# Patient Record
Sex: Female | Born: 1995 | Race: White | Hispanic: No | Marital: Single | Smoking: Current every day smoker
Health system: Southern US, Community
[De-identification: ages and names within clinical notes are randomized; demographics above are authoritative.]

## PROBLEM LIST (undated history)

## (undated) DIAGNOSIS — F410 Panic disorder [episodic paroxysmal anxiety] without agoraphobia: Secondary | ICD-10-CM

## (undated) DIAGNOSIS — R51 Headache: Secondary | ICD-10-CM

## (undated) DIAGNOSIS — F419 Anxiety disorder, unspecified: Secondary | ICD-10-CM

## (undated) HISTORY — PX: WISDOM TOOTH EXTRACTION: SHX21

## (undated) HISTORY — PX: TEAR DUCT PROBING: SHX793

## (undated) HISTORY — DX: Headache: R51

---
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
## 2013-01-27 ENCOUNTER — Ambulatory Visit: Payer: 59 | Admitting: Nurse Practitioner

## 2013-08-15 ENCOUNTER — Emergency Department (HOSPITAL_COMMUNITY)
Admission: EM | Admit: 2013-08-15 | Discharge: 2013-08-15 | Disposition: A | Discharge status: Home / Self Care | Payer: Worker's Compensation | Attending: Emergency Medicine | Admitting: Emergency Medicine

## 2013-08-15 ENCOUNTER — Emergency Department (HOSPITAL_COMMUNITY): Payer: Worker's Compensation

## 2013-08-15 ENCOUNTER — Encounter (HOSPITAL_COMMUNITY): Payer: Self-pay | Admitting: Emergency Medicine

## 2013-08-15 DIAGNOSIS — F17200 Nicotine dependence, unspecified, uncomplicated: Secondary | ICD-10-CM | POA: Insufficient documentation

## 2013-08-15 DIAGNOSIS — Z79899 Other long term (current) drug therapy: Secondary | ICD-10-CM | POA: Insufficient documentation

## 2013-08-15 DIAGNOSIS — Z880 Allergy status to penicillin: Secondary | ICD-10-CM | POA: Insufficient documentation

## 2013-08-15 DIAGNOSIS — Y939 Activity, unspecified: Secondary | ICD-10-CM | POA: Insufficient documentation

## 2013-08-15 DIAGNOSIS — M25522 Pain in left elbow: Secondary | ICD-10-CM

## 2013-08-15 DIAGNOSIS — S6990XA Unspecified injury of unspecified wrist, hand and finger(s), initial encounter: Principal | ICD-10-CM

## 2013-08-15 DIAGNOSIS — F411 Generalized anxiety disorder: Secondary | ICD-10-CM | POA: Insufficient documentation

## 2013-08-15 DIAGNOSIS — Y990 Civilian activity done for income or pay: Secondary | ICD-10-CM | POA: Insufficient documentation

## 2013-08-15 DIAGNOSIS — S59909A Unspecified injury of unspecified elbow, initial encounter: Secondary | ICD-10-CM | POA: Insufficient documentation

## 2013-08-15 DIAGNOSIS — W010XXA Fall on same level from slipping, tripping and stumbling without subsequent striking against object, initial encounter: Secondary | ICD-10-CM | POA: Insufficient documentation

## 2013-08-15 DIAGNOSIS — S59919A Unspecified injury of unspecified forearm, initial encounter: Principal | ICD-10-CM

## 2013-08-15 DIAGNOSIS — Y9289 Other specified places as the place of occurrence of the external cause: Secondary | ICD-10-CM | POA: Insufficient documentation

## 2013-08-15 MED ORDER — IBUPROFEN 800 MG PO TABS
800.0000 mg | ORAL_TABLET | Freq: Once | ORAL | Status: AC
  Administered 2013-08-15: 800 mg via ORAL
  Filled 2013-08-15: qty 1

## 2013-08-15 MED ORDER — NAPROXEN 500 MG PO TABS: 500.0000 mg | ORAL_TABLET | Freq: Two times a day (BID) | ORAL | Status: AC

## 2013-08-15 MED ORDER — OXYCODONE-ACETAMINOPHEN 5-325 MG PO TABS: 2.0000 | ORAL_TABLET | Freq: Once | ORAL | Status: DC

## 2013-08-15 NOTE — Discharge Instructions (Signed)
°Arthralgia °Your caregiver has diagnosed you as suffering from an arthralgia. Arthralgia means there is pain in a joint. This can come from many reasons including: °· Bruising the joint which causes soreness (inflammation) in the joint. °· Wear and tear on the joints which occur as we grow older (osteoarthritis). °· Overusing the joint. °· Various forms of arthritis. °· Infections of the joint. °Regardless of the cause of pain in your joint, most of these different pains respond to anti-inflammatory drugs and rest. The exception to this is when a joint is infected, and these cases are treated with antibiotics, if it is a bacterial infection. °HOME CARE INSTRUCTIONS  °· Rest the injured area for as long as directed by your caregiver. Then slowly start using the joint as directed by your caregiver and as the pain allows. Crutches as directed may be useful if the ankles, knees or hips are involved. If the knee was splinted or casted, continue use and care as directed. If an stretchy or elastic wrapping bandage has been applied today, it should be removed and re-applied every 3 to 4 hours. It should not be applied tightly, but firmly enough to keep swelling down. Watch toes and feet for swelling, bluish discoloration, coldness, numbness or excessive pain. If any of these problems (symptoms) occur, remove the ace bandage and re-apply more loosely. If these symptoms persist, contact your caregiver or return to this location. °· For the first 24 hours, keep the injured extremity elevated on pillows while lying down. °· Apply ice for 15-20 minutes to the sore joint every couple hours while awake for the first half day. Then 03-04 times per day for the first 48 hours. Put the ice in a plastic bag and place a towel between the bag of ice and your skin. °· Wear any splinting, casting, elastic bandage applications, or slings as instructed. °· Only take over-the-counter or prescription medicines for pain, discomfort, or fever  as directed by your caregiver. Do not use aspirin immediately after the injury unless instructed by your physician. Aspirin can cause increased bleeding and bruising of the tissues. °· If you were given crutches, continue to use them as instructed and do not resume weight bearing on the sore joint until instructed. °Persistent pain and inability to use the sore joint as directed for more than 2 to 3 days are warning signs indicating that you should see a caregiver for a follow-up visit as soon as possible. Initially, a hairline fracture (break in bone) may not be evident on X-rays. Persistent pain and swelling indicate that further evaluation, non-weight bearing or use of the joint (use of crutches or slings as instructed), or further X-rays are indicated. X-rays may sometimes not show a small fracture until a week or 10 days later. Make a follow-up appointment with your own caregiver or one to whom we have referred you. A radiologist (specialist in reading X-rays) may read your X-rays. Make sure you know how you are to obtain your X-ray results. Do not assume everything is normal if you do not hear from us. °SEEK MEDICAL CARE IF: °Bruising, swelling, or pain increases. °SEEK IMMEDIATE MEDICAL CARE IF:  °· Your fingers or toes are numb or blue. °· The pain is not responding to medications and continues to stay the same or get worse. °· The pain in your joint becomes severe. °· You develop a fever over 102° F (38.9° C). °· It becomes impossible to move or use the joint. °MAKE SURE YOU:  °·   Understand these instructions. °· Will watch your condition. °· Will get help right away if you are not doing well or get worse. °Document Released: 02/23/2005 Document Revised: 05/18/2011 Document Reviewed: 10/12/2007 °ExitCare® Patient Information ©2014 [employer]. °RICE: Routine Care for Injuries °The routine care of many injuries includes Rest, Ice, Compression, and Elevation (RICE). °HOME CARE INSTRUCTIONS °· Rest is needed  to allow your body to heal. Routine activities can usually be resumed when comfortable. Injured tendons and bones can take up to 6 weeks to heal. Tendons are the cord-like structures that attach muscle to bone. °· Ice following an injury helps keep the swelling down and reduces pain. °· Put ice in a plastic bag. °· Place a towel between your skin and the bag. °· Leave the ice on for 15-20 minutes, 03-04 times a day. Do this while awake, for the first 24 to 48 hours. After that, continue as directed by your caregiver. °· Compression helps keep swelling down. It also gives support and helps with discomfort. If an elastic bandage has been applied, it should be removed and reapplied every 3 to 4 hours. It should not be applied tightly, but firmly enough to keep swelling down. Watch fingers or toes for swelling, bluish discoloration, coldness, numbness, or excessive pain. If any of these problems occur, remove the bandage and reapply loosely. Contact your caregiver if these problems continue. °· Elevation helps reduce swelling and decreases pain. With extremities, such as the arms, hands, legs, and feet, the injured area should be placed near or above the level of the heart, if possible. °SEEK IMMEDIATE MEDICAL CARE IF: °· You have persistent pain and swelling. °· You develop redness, numbness, or unexpected weakness. °· Your symptoms are getting worse rather than improving after several days. °These symptoms may indicate that further evaluation or further X-rays are needed. Sometimes, X-rays may not show a small broken bone (fracture) until 1 week or 10 days later. Make a follow-up appointment with your caregiver. Ask when your X-ray results will be ready. Make sure you get your X-ray results. °Document Released: 06/07/2000 Document Revised: 05/18/2011 Document Reviewed: 07/25/2010 °ExitCare® Patient Information ©2014 [employer]. ° °

## 2013-08-15 NOTE — ED Notes (Signed)
Pt reports slipped on water and fell while at work.  Landed on her L elbow.  Pt reports L elbow pain.

## 2013-08-15 NOTE — ED Provider Notes (Signed)
CSN: [id number]     Arrival date & time 08/15/13  2133 History   First MD Initiated Contact with Patient 08/15/13 2257    This chart was scribed for non-physician practitioner, Antony MaduraKelly Monnica Saltsman, PA, working with Loren Raceravid Yelverton, MD by Marica OtterNusrat Rahman, ED Scribe. This patient was seen in room WTR9/WTR9 and the patient's care was started at 11:10 PM.  Chief Complaint  Patient presents with  . Fall   The history is provided by the patient. No language interpreter was used.   HPI Comments: Pamela SpeckingCaitlin A Rammel is a 18 y.o. female who presents to the Emergency Department complaining of a fall and associated left elbow pain onset this evening. Pt reports that she slipped on water while at work and landed on her left elbow. PT rates her pain a 8 out of 10. Pt complains of associated numbness. Pt denies head trauma or LOC.    Past Medical History  Diagnosis Date  . Panic attacks   . Anxiety   . [id number](478.2Headache(784.0)    Past Surgical History  Procedure Laterality Date  . Wisdom tooth extraction    . Tear duct probing     Family History  Problem Relation Age of Onset  . Migraines Mother   . High blood pressure Mother    History  Substance Use Topics  . Smoking status: Current Every Day Smoker    Types: Cigarettes  . Smokeless tobacco: Never Used     Comment: One pack daily  . Alcohol Use: No   OB History   Grav Para Term Preterm Abortions TAB SAB Ect Mult Living                  Review of Systems  Musculoskeletal:       Left elbow pain   All other systems reviewed and are negative.   Allergies  Amoxicillin-pot clavulanate; Glycopyrrolate; and Halothane  Home Medications   Prior to Admission medications   Medication Sig Start Date End Date Taking? Authorizing Provider  escitalopram (LEXAPRO) 10 MG tablet Take 10 mg by mouth daily.    Historical Provider, MD  LORazepam (ATIVAN) 1 MG tablet Take 1 tablet (1 mg total) by mouth 3 (three) times daily as needed for anxiety. 10/25/12    Gilda Creasehristopher J. Pollina, MD  naproxen (NAPROSYN) 500 MG tablet Take 1 tablet (500 mg total) by mouth 2 (two) times daily. 08/15/13   Antony MaduraKelly Demonica Farrey, PA-C   Triage Vitals: BP 113/72  Pulse 77  Temp(Src) 98 F (36.7 C) (Oral)  Resp 18  SpO2 100%  LMP 07/29/2013  Physical Exam  Nursing note and vitals reviewed. Constitutional: She is oriented to person, place, and time. She appears well-developed and well-nourished. No distress.  HENT:  Head: Normocephalic and atraumatic.  Eyes: Conjunctivae and EOM are normal. No scleral icterus.  Neck: Normal range of motion.  Cardiovascular: Normal rate, regular rhythm and intact distal pulses.   Distal radial pulse 2+ in left upper extremity. Capillary refill normal in all digits of left hand.  Pulmonary/Chest: Effort normal. No respiratory distress.  Musculoskeletal: Normal range of motion.       Left elbow: She exhibits normal range of motion, no swelling, no effusion and no deformity. Tenderness found. Olecranon process tenderness noted.       Left upper arm: Normal.       Left forearm: Normal.  Normal range of motion of left elbow with 5/5 strength against resistance of left elbow with flexion and extension.  Neurological: She  is alert and oriented to person, place, and time.  Sensation to light touch intact. Finger to thumb opposition intact in left hand.  Skin: Skin is warm and dry. No rash noted. She is not diaphoretic. No erythema. No pallor.  Psychiatric: She has a normal mood and affect. Her behavior is normal.    ED Course  Procedures (including critical care time) DIAGNOSTIC STUDIES: Oxygen Saturation is 100% on RA, normal by my interpretation.    COORDINATION OF CARE: 11:13 PM-Discussed treatment plan which includes icing the affected area, using a sling intermittently for comfort, meds, with pt at bedside and pt agreed to plan.   Labs Review Labs Reviewed - No data to display  Imaging Review Dg Elbow Complete Left  08/15/2013    CLINICAL DATA:  Lateral left elbow pain after a fall today.  EXAM: LEFT ELBOW - COMPLETE 3+ VIEW  COMPARISON:  None.  FINDINGS: There is no evidence of fracture, dislocation, or joint effusion. There is no evidence of arthropathy or other focal bone abnormality. Soft tissues are unremarkable.  IMPRESSION: Negative.   Electronically Signed   By: Burman Nieves M.D.   On: 08/15/2013 22:33     EKG Interpretation None      MDM   Final diagnoses:  Elbow pain, left    Complicated left elbow pain secondary to mechanical fall. No head trauma or LOC. Patient neurovascularly intact. No sensory deficits appreciated. No deformity or crepitus. Imaging negative for fracture or dislocation. Patient given foam arm sling for comfort. RICE advised and return precautions provided. Patient agreeable to plan with no unaddressed concerns.  I personally performed the services described in this documentation, which was scribed in my presence. The recorded information has been reviewed and is accurate.   Filed Vitals:   08/15/13 2210  BP: 113/72  Pulse: 77  Temp: 98 F (36.7 C)  TempSrc: Oral  Resp: 18  SpO2: 100%      Antony Madura, PA-C 08/15/13 2331

## 2013-08-15 NOTE — ED Notes (Signed)
Patient is alert and oriented x3.  She was given DC instructions and follow up visit instructions.  Patient gave verbal understanding. She was DC ambulatory under her own power to home.  V/S stable.  He was not showing any signs of distress on DC 

## 2013-08-16 NOTE — ED Provider Notes (Signed)
Medical screening examination/treatment/procedure(s) were performed by non-physician practitioner and as supervising physician I was immediately available for consultation/collaboration.   EKG Interpretation None        Alexei Doswell, MD 08/16/13 0219 

## 2015-11-12 ENCOUNTER — Emergency Department (HOSPITAL_COMMUNITY)
Admission: EM | Admit: 2015-11-12 | Discharge: 2015-11-13 | Disposition: A | Discharge status: Home / Self Care | Payer: 59 | Attending: Emergency Medicine | Admitting: Emergency Medicine

## 2015-11-12 ENCOUNTER — Emergency Department (HOSPITAL_COMMUNITY): Payer: 59

## 2015-11-12 ENCOUNTER — Encounter (HOSPITAL_COMMUNITY): Payer: Self-pay

## 2015-11-12 DIAGNOSIS — S93602A Unspecified sprain of left foot, initial encounter: Secondary | ICD-10-CM | POA: Insufficient documentation

## 2015-11-12 DIAGNOSIS — F17210 Nicotine dependence, cigarettes, uncomplicated: Secondary | ICD-10-CM | POA: Insufficient documentation

## 2015-11-12 DIAGNOSIS — Y999 Unspecified external cause status: Secondary | ICD-10-CM | POA: Insufficient documentation

## 2015-11-12 DIAGNOSIS — Y92009 Unspecified place in unspecified non-institutional (private) residence as the place of occurrence of the external cause: Secondary | ICD-10-CM | POA: Insufficient documentation

## 2015-11-12 DIAGNOSIS — Y9389 Activity, other specified: Secondary | ICD-10-CM | POA: Insufficient documentation

## 2015-11-12 DIAGNOSIS — Z791 Long term (current) use of non-steroidal anti-inflammatories (NSAID): Secondary | ICD-10-CM | POA: Insufficient documentation

## 2015-11-12 DIAGNOSIS — X501XXA Overexertion from prolonged static or awkward postures, initial encounter: Secondary | ICD-10-CM | POA: Insufficient documentation

## 2015-11-12 NOTE — ED Provider Notes (Signed)
[facility]-EMERGENCY DEPT Provider Note   CSN: [id number] Arrival date & time: 11/12/15  2111  By signing my name below, I, Pamela [name], attest that this documentation has been prepared under the direction and in the presence of [facility], PA-C.  Electronically Signed: Rosario [name], ED Scribe. 11/12/15. 12:27 AM.  History   Chief Complaint Chief Complaint  Patient presents with  . Foot Injury   The history is provided by the patient. No language interpreter was used.    HPI Comments: Pamela [name] is a 20 y.o. female who presents to the Emergency Department complaining of sudden onset, left foot pain onset ~1 day ago. Pt reports that she was getting up from a chair in her home when she accidentally inverted her left foot, sustaining her pain. Her pain is exacerbated with movement of her toes and attempted weight bearing. She has been taking Ibuprofen and applying ice to the area with minimal relief of her pain. Denies numbness.   PCP: Leanor Rubenstein, MD  Past Medical History:  Diagnosis Date  . Anxiety   . Headache(784.0)   . Panic attacks    Patient Active Problem List   Diagnosis Date Noted  . Migraine without aura, without mention of intractable migraine without mention of status migrainosus 10/27/2012  . Panic attacks 10/27/2012   Past Surgical History:  Procedure Laterality Date  . TEAR DUCT PROBING    . WISDOM TOOTH EXTRACTION     OB History    No data available     Home Medications    Prior to Admission medications   Medication Sig Start Date End Date Taking? Authorizing Provider  escitalopram (LEXAPRO) 10 MG tablet Take 10 mg by mouth daily.    Historical Provider, MD  LORazepam (ATIVAN) 1 MG tablet Take 1 tablet (1 mg total) by mouth 3 (three) times daily as needed for anxiety. 10/25/12   Gilda Crease, MD  naproxen (NAPROSYN) 500 MG tablet Take 1 tablet (500 mg total) by mouth 2 (two) times daily. 08/15/13   Antony Madura, PA-C   Family  History Family History  Problem Relation Age of Onset  . Migraines Mother   . High blood pressure Mother    Social History Social History  Substance Use Topics  . Smoking status: Current Every Day Smoker    Types: Cigarettes  . Smokeless tobacco: Never Used     Comment: One pack daily  . Alcohol use No   Allergies   Amoxicillin-pot clavulanate; Glycopyrrolate; and Halothane  Review of Systems Review of Systems  Musculoskeletal: Positive for arthralgias and myalgias.  Neurological: Negative for numbness.   Physical Exam Updated Vital Signs BP 114/67 (BP Location: Left Arm)   Pulse 97   Temp 98.5 F (36.9 C) (Oral)   Resp 18   Ht 5\' 8"  (1.727 m)   Wt 150 lb (68 kg)   LMP 10/27/2015 (Approximate)   SpO2 98%   BMI 22.81 kg/m   Physical Exam  Constitutional: She appears well-developed and well-nourished.  HENT:  Head: Normocephalic and atraumatic.  Neck: Neck supple.  Pulmonary/Chest: Effort normal.  Musculoskeletal:  Left dorsal foot over the lateral side with large area of ecchymosis and tenderness. No break in skin. No tenderness of the ankle. Sensation is intact. Cap refill <3 seconds. No tenderness of the left lower leg.   Neurological: She is alert.  Nursing note and vitals reviewed.  ED Treatments / Results  DIAGNOSTIC STUDIES: Oxygen Saturation is 98% on RA,  normal by my interpretation.   COORDINATION OF CARE: 12:27 AM-Discussed next steps with pt. Pt verbalized understanding and is agreeable with the plan.   Radiology Dg Foot Complete Left  Result Date: 11/12/2015 CLINICAL DATA:  Pain and bruised lateral side of foot. EXAM: LEFT FOOT - COMPLETE 3+ VIEW COMPARISON:  Left foot radiograph 08/03/2012 FINDINGS: There is no evidence of fracture or dislocation. There is no evidence of arthropathy or other focal bone abnormality. Soft tissues are unremarkable. IMPRESSION: No fracture or dislocation of the left foot. Electronically Signed   By: Deatra RobinsonKevin  Herman M.D.    On: 11/12/2015 23:52   Procedures Procedures (including critical care time)  Medications Ordered in ED Medications - No data to display   Initial Impression / Assessment and Plan / ED Course  I have reviewed the triage vital signs and the nursing notes.  Pertinent labs & imaging results that were available during my care of the patient were reviewed by me and considered in my medical decision making (see chart for details).  Clinical Course   Vitals:   11/12/15 2219 11/13/15 0037  BP: 114/67 138/77  Pulse: 97 73  Resp: 18 18  Temp: 98.5 F (36.9 C)    Results for orders placed or performed during the hospital encounter of 10/25/12  CBC  Result Value Ref Range   WBC 7.2 4.0 - 10.5 K/uL   RBC 4.56 3.87 - 5.11 MIL/uL   Hemoglobin 13.3 12.0 - 15.0 g/dL   HCT 78.239.2 95.636.0 - 21.346.0 %   MCV 86.0 78.0 - 100.0 fL   MCH 29.2 26.0 - 34.0 pg   MCHC 33.9 30.0 - 36.0 g/dL   RDW 08.612.2 57.811.5 - 46.915.5 %   Platelets 281 150 - 400 K/uL  Urinalysis, Routine w reflex microscopic  Result Value Ref Range   Color, Urine YELLOW YELLOW   APPearance CLEAR CLEAR   Specific Gravity, Urine 1.009 1.005 - 1.030   pH 6.5 5.0 - 8.0   Glucose, UA NEGATIVE NEGATIVE mg/dL   Hgb urine dipstick NEGATIVE NEGATIVE   Bilirubin Urine NEGATIVE NEGATIVE   Ketones, ur NEGATIVE NEGATIVE mg/dL   Protein, ur NEGATIVE NEGATIVE mg/dL   Urobilinogen, UA 0.2 0.0 - 1.0 mg/dL   Nitrite NEGATIVE NEGATIVE   Leukocytes, UA NEGATIVE NEGATIVE  Basic metabolic panel  Result Value Ref Range   Sodium 138 135 - 145 mEq/L   Potassium 3.3 (L) 3.5 - 5.1 mEq/L   Chloride 103 96 - 112 mEq/L   CO2 25 19 - 32 mEq/L   Glucose, Bld 113 (H) 70 - 99 mg/dL   BUN 7 6 - 23 mg/dL   Creatinine, Ser 6.290.72 0.50 - 1.10 mg/dL   Calcium 9.7 8.4 - 52.810.5 mg/dL   GFR calc non Af Amer >90 >90 mL/min   GFR calc Af Amer >90 >90 mL/min   Dg Foot Complete Left  Result Date: 11/12/2015 CLINICAL DATA:  Pain and bruised lateral side of foot. EXAM: LEFT  FOOT - COMPLETE 3+ VIEW COMPARISON:  Left foot radiograph 08/03/2012 FINDINGS: There is no evidence of fracture or dislocation. There is no evidence of arthropathy or other focal bone abnormality. Soft tissues are unremarkable. IMPRESSION: No fracture or dislocation of the left foot. Electronically Signed   By: Deatra RobinsonKevin  Herman M.D.   On: 11/12/2015 23:52   Medications  ibuprofen (ADVIL,MOTRIN) tablet 800 mg (800 mg Oral Given 11/13/15 0033)     Final Clinical Impressions(s) / ED Diagnoses   Final diagnoses:  None    New Prescriptions New Prescriptions   No medications on file  All questions answered to patient's satisfaction. Based on history and exam patient has been appropriately medically screened and emergency conditions excluded. Patient is stable for discharge at this time. Follow up with your PMD for recheck in 2 days and strict return precautions given  I personally performed the services described in this documentation, which was scribed in my presence. The recorded information has been reviewed and is accurate.   Marland Kitchen      Cy Blamer, MD 11/13/15 [phone]

## 2015-11-12 NOTE — ED Triage Notes (Signed)
Pt presents with c/o left foot injury. Pt reports that she was attempting to get into the recliner but her foot had fallen asleep and she twisted her foot when she stood up. Pt reports she has been able to walk only on her heel since the incident.

## 2015-11-13 ENCOUNTER — Encounter (HOSPITAL_COMMUNITY): Payer: Self-pay | Admitting: Emergency Medicine

## 2015-11-13 MED ORDER — IBUPROFEN 800 MG PO TABS
800.0000 mg | ORAL_TABLET | Freq: Once | ORAL | Status: AC
  Administered 2015-11-13: 800 mg via ORAL
  Filled 2015-11-13: qty 1

## 2015-11-13 MED ORDER — IBUPROFEN 800 MG PO TABS: 800.0000 mg | ORAL_TABLET | Freq: Three times a day (TID) | ORAL | 0 refills | Status: AC | PRN

## 2015-11-13 NOTE — Discharge Instructions (Signed)
Read the information below.  Use the prescribed medication as directed.  Please discuss all new medications with your pharmacist.  You may return to the Emergency Department at any time for worsening condition or any new symptoms that concern you.   If you develop uncontrolled pain, weakness or numbness of the extremity, severe discoloration of the skin, or you are unable to walk or move your toes, return to the ER for a recheck.    °

## 2018-04-13 DIAGNOSIS — Z01419 Encounter for gynecological examination (general) (routine) without abnormal findings: Secondary | ICD-10-CM | POA: Diagnosis not present

## 2018-04-13 DIAGNOSIS — Z6826 Body mass index (BMI) 26.0-26.9, adult: Secondary | ICD-10-CM | POA: Diagnosis not present

## 2018-04-13 DIAGNOSIS — Z113 Encounter for screening for infections with a predominantly sexual mode of transmission: Secondary | ICD-10-CM | POA: Diagnosis not present

## 2018-04-13 MED FILL — BLISOVI 24 FE 1-20 MG-MCG(2: 1-20 | 84 days supply | Qty: 84 | Fill #0

## 2018-07-14 MED FILL — BLISOVI 24 FE 1-20 MG-MCG(2: 1-20 | 84 days supply | Qty: 84 | Fill #1

## 2018-10-03 MED FILL — BLISOVI 24 FE 1-20 MG-MCG(2: 1-20 | 84 days supply | Qty: 84 | Fill #2

## 2018-12-30 MED FILL — BLISOVI 24 FE 1-20 MG-MCG(2: 1-20 | 84 days supply | Qty: 84 | Fill #3

## 2019-02-07 DIAGNOSIS — J019 Acute sinusitis, unspecified: Secondary | ICD-10-CM | POA: Diagnosis not present

## 2019-02-07 MED FILL — HYDROCODONE-HOMATROPINE SOL: 5-1.5 | 5 days supply | Qty: 100 | Fill #0

## 2019-03-13 DIAGNOSIS — J0391 Acute recurrent tonsillitis, unspecified: Secondary | ICD-10-CM | POA: Diagnosis not present

## 2019-03-13 DIAGNOSIS — J351 Hypertrophy of tonsils: Secondary | ICD-10-CM | POA: Diagnosis not present

## 2019-03-21 MED FILL — BLISOVI 24 FE 1-20 MG-MCG(2: 1-20 | 84 days supply | Qty: 84 | Fill #4

## 2019-03-24 ENCOUNTER — Other Ambulatory Visit: Payer: Self-pay | Admitting: Otolaryngology

## 2019-04-07 ENCOUNTER — Other Ambulatory Visit: Payer: Self-pay

## 2019-04-07 ENCOUNTER — Encounter (HOSPITAL_BASED_OUTPATIENT_CLINIC_OR_DEPARTMENT_OTHER): Payer: Self-pay | Admitting: Otolaryngology

## 2019-04-13 ENCOUNTER — Other Ambulatory Visit (HOSPITAL_COMMUNITY)
Admission: RE | Admit: 2019-04-13 | Discharge: 2019-04-13 | Disposition: A | Discharge status: Home / Self Care | Payer: 59 | Source: Ambulatory Visit | Attending: Otolaryngology | Admitting: Otolaryngology

## 2019-04-13 DIAGNOSIS — Z20822 Contact with and (suspected) exposure to covid-19: Secondary | ICD-10-CM | POA: Insufficient documentation

## 2019-04-13 DIAGNOSIS — Z01812 Encounter for preprocedural laboratory examination: Secondary | ICD-10-CM | POA: Insufficient documentation

## 2019-04-13 LAB — SARS CORONAVIRUS 2 (TAT 6-24 HRS): SARS Coronavirus 2: NEGATIVE

## 2019-04-17 ENCOUNTER — Encounter (HOSPITAL_BASED_OUTPATIENT_CLINIC_OR_DEPARTMENT_OTHER)
Admission: RE | Disposition: A | Discharge status: Home / Self Care | Payer: Self-pay | Source: Home / Self Care | Attending: Otolaryngology

## 2019-04-17 ENCOUNTER — Ambulatory Visit (HOSPITAL_BASED_OUTPATIENT_CLINIC_OR_DEPARTMENT_OTHER): Payer: 59 | Admitting: Certified Registered"

## 2019-04-17 ENCOUNTER — Encounter (HOSPITAL_BASED_OUTPATIENT_CLINIC_OR_DEPARTMENT_OTHER): Payer: Self-pay | Admitting: Otolaryngology

## 2019-04-17 ENCOUNTER — Ambulatory Visit (HOSPITAL_BASED_OUTPATIENT_CLINIC_OR_DEPARTMENT_OTHER)
Admission: RE | Admit: 2019-04-17 | Discharge: 2019-04-17 | Disposition: A | Discharge status: Home / Self Care | Payer: 59 | Attending: Otolaryngology | Admitting: Otolaryngology

## 2019-04-17 ENCOUNTER — Other Ambulatory Visit: Payer: Self-pay

## 2019-04-17 DIAGNOSIS — Z880 Allergy status to penicillin: Secondary | ICD-10-CM | POA: Insufficient documentation

## 2019-04-17 DIAGNOSIS — J0391 Acute recurrent tonsillitis, unspecified: Secondary | ICD-10-CM | POA: Insufficient documentation

## 2019-04-17 DIAGNOSIS — G43009 Migraine without aura, not intractable, without status migrainosus: Secondary | ICD-10-CM | POA: Diagnosis not present

## 2019-04-17 DIAGNOSIS — Z79899 Other long term (current) drug therapy: Secondary | ICD-10-CM | POA: Insufficient documentation

## 2019-04-17 DIAGNOSIS — J351 Hypertrophy of tonsils: Secondary | ICD-10-CM | POA: Diagnosis not present

## 2019-04-17 DIAGNOSIS — J039 Acute tonsillitis, unspecified: Secondary | ICD-10-CM | POA: Diagnosis present

## 2019-04-17 DIAGNOSIS — F17210 Nicotine dependence, cigarettes, uncomplicated: Secondary | ICD-10-CM | POA: Diagnosis not present

## 2019-04-17 DIAGNOSIS — F419 Anxiety disorder, unspecified: Secondary | ICD-10-CM | POA: Insufficient documentation

## 2019-04-17 HISTORY — PX: TONSILLECTOMY: SHX5217

## 2019-04-17 LAB — POCT PREGNANCY, URINE: Preg Test, Ur: NEGATIVE

## 2019-04-17 SURGERY — TONSILLECTOMY
Anesthesia: General | Site: Throat | Laterality: Bilateral

## 2019-04-17 MED ORDER — PROPOFOL 500 MG/50ML IV EMUL
INTRAVENOUS | Status: AC
  Filled 2019-04-17: qty 50

## 2019-04-17 MED ORDER — ONDANSETRON HCL 4 MG/2ML IJ SOLN: 4.0000 mg | INTRAMUSCULAR | Status: DC | PRN

## 2019-04-17 MED ORDER — HYDROCODONE-ACETAMINOPHEN 7.5-325 MG/15ML PO SOLN: 10.0000 mL | ORAL | 0 refills | Status: DC | PRN

## 2019-04-17 MED ORDER — LIDOCAINE 2% (20 MG/ML) 5 ML SYRINGE
INTRAMUSCULAR | Status: DC | PRN
  Administered 2019-04-17: 80 mg via INTRAVENOUS

## 2019-04-17 MED ORDER — PHENOL 1.4 % MT LIQD: 1.0000 | OROMUCOSAL | Status: DC | PRN

## 2019-04-17 MED ORDER — LIDOCAINE 2% (20 MG/ML) 5 ML SYRINGE
INTRAMUSCULAR | Status: AC
  Filled 2019-04-17: qty 5

## 2019-04-17 MED ORDER — DEXMEDETOMIDINE HCL 200 MCG/2ML IV SOLN
INTRAVENOUS | Status: DC | PRN
  Administered 2019-04-17 (×3): 8 ug via INTRAVENOUS

## 2019-04-17 MED ORDER — HYDROCODONE-ACETAMINOPHEN 7.5-325 MG/15ML PO SOLN: 10.0000 mL | Freq: Four times a day (QID) | ORAL | 0 refills | Status: DC | PRN

## 2019-04-17 MED ORDER — ROCURONIUM BROMIDE 100 MG/10ML IV SOLN
INTRAVENOUS | Status: DC | PRN
  Administered 2019-04-17: 50 mg via INTRAVENOUS

## 2019-04-17 MED ORDER — DEXAMETHASONE SODIUM PHOSPHATE 10 MG/ML IJ SOLN
10.0000 mg | Freq: Once | INTRAMUSCULAR | Status: AC
  Administered 2019-04-17: 10 mg via INTRAVENOUS
  Filled 2019-04-17: qty 1

## 2019-04-17 MED ORDER — ROCURONIUM BROMIDE 10 MG/ML (PF) SYRINGE
PREFILLED_SYRINGE | INTRAVENOUS | Status: AC
  Filled 2019-04-17: qty 10

## 2019-04-17 MED ORDER — MIDAZOLAM HCL 5 MG/5ML IJ SOLN
INTRAMUSCULAR | Status: DC | PRN
  Administered 2019-04-17: 2 mg via INTRAVENOUS

## 2019-04-17 MED ORDER — DEXMEDETOMIDINE HCL IN NACL 200 MCG/50ML IV SOLN
INTRAVENOUS | Status: AC
  Filled 2019-04-17: qty 50

## 2019-04-17 MED ORDER — FENTANYL CITRATE (PF) 100 MCG/2ML IJ SOLN
INTRAMUSCULAR | Status: AC
  Filled 2019-04-17: qty 2

## 2019-04-17 MED ORDER — CEFAZOLIN SODIUM-DEXTROSE 2-4 GM/100ML-% IV SOLN
2000.0000 mg | Freq: Once | INTRAVENOUS | Status: AC
  Administered 2019-04-17: 08:00:00 2 g via INTRAVENOUS

## 2019-04-17 MED ORDER — ONDANSETRON HCL 4 MG PO TABS: 4.0000 mg | ORAL_TABLET | ORAL | Status: DC | PRN

## 2019-04-17 MED ORDER — PROPOFOL 10 MG/ML IV BOLUS
INTRAVENOUS | Status: AC
  Filled 2019-04-17: qty 20

## 2019-04-17 MED ORDER — OXYCODONE HCL 5 MG PO TABS: 5.0000 mg | ORAL_TABLET | Freq: Once | ORAL | Status: DC | PRN

## 2019-04-17 MED ORDER — PROPOFOL 500 MG/50ML IV EMUL
INTRAVENOUS | Status: DC | PRN
  Administered 2019-04-17: 150 ug/kg/min via INTRAVENOUS

## 2019-04-17 MED ORDER — LACTATED RINGERS IV SOLN: INTRAVENOUS | Status: DC

## 2019-04-17 MED ORDER — DEXAMETHASONE SODIUM PHOSPHATE 10 MG/ML IJ SOLN
10.0000 mg | Freq: Once | INTRAMUSCULAR | Status: AC
  Administered 2019-04-17: 08:00:00 10 mg via INTRAVENOUS

## 2019-04-17 MED ORDER — FENTANYL CITRATE (PF) 100 MCG/2ML IJ SOLN
25.0000 ug | INTRAMUSCULAR | Status: DC | PRN
  Administered 2019-04-17: 50 ug via INTRAVENOUS

## 2019-04-17 MED ORDER — IBUPROFEN 100 MG/5ML PO SUSP
ORAL | Status: AC
  Filled 2019-04-17: qty 20

## 2019-04-17 MED ORDER — MIDAZOLAM HCL 2 MG/2ML IJ SOLN
INTRAMUSCULAR | Status: AC
  Filled 2019-04-17: qty 2

## 2019-04-17 MED ORDER — ONDANSETRON HCL 4 MG/2ML IJ SOLN
INTRAMUSCULAR | Status: DC | PRN
  Administered 2019-04-17: 4 mg via INTRAVENOUS

## 2019-04-17 MED ORDER — CEFAZOLIN SODIUM-DEXTROSE 2-4 GM/100ML-% IV SOLN
INTRAVENOUS | Status: AC
  Filled 2019-04-17: qty 100

## 2019-04-17 MED ORDER — HYDROCODONE-ACETAMINOPHEN 7.5-325 MG/15ML PO SOLN
10.0000 mL | ORAL | Status: DC | PRN
  Administered 2019-04-17: 15 mL via ORAL
  Filled 2019-04-17: qty 15

## 2019-04-17 MED ORDER — MORPHINE SULFATE (PF) 4 MG/ML IV SOLN: 2.0000 mg | INTRAVENOUS | Status: DC | PRN

## 2019-04-17 MED ORDER — FENTANYL CITRATE (PF) 100 MCG/2ML IJ SOLN
INTRAMUSCULAR | Status: DC | PRN
  Administered 2019-04-17 (×3): 50 ug via INTRAVENOUS

## 2019-04-17 MED ORDER — PROPOFOL 10 MG/ML IV BOLUS
INTRAVENOUS | Status: DC | PRN
  Administered 2019-04-17: 200 mg via INTRAVENOUS

## 2019-04-17 MED ORDER — OXYCODONE HCL 5 MG/5ML PO SOLN: 5.0000 mg | Freq: Once | ORAL | Status: DC | PRN

## 2019-04-17 MED ORDER — SUGAMMADEX SODIUM 200 MG/2ML IV SOLN
INTRAVENOUS | Status: DC | PRN
  Administered 2019-04-17: 300 mg via INTRAVENOUS

## 2019-04-17 MED ORDER — ONDANSETRON HCL 4 MG/2ML IJ SOLN: 4.0000 mg | Freq: Once | INTRAMUSCULAR | Status: DC | PRN

## 2019-04-17 MED ORDER — IBUPROFEN 100 MG/5ML PO SUSP
400.0000 mg | Freq: Four times a day (QID) | ORAL | Status: DC | PRN
  Administered 2019-04-17: 400 mg via ORAL

## 2019-04-17 MED ORDER — DEXTROSE IN LACTATED RINGERS 5 % IV SOLN: INTRAVENOUS | Status: DC

## 2019-04-17 MED FILL — HYDROCOD-APAP 7.5-325/15ML: 7.5-325 | 8 days supply | Qty: 473 | Fill #0

## 2019-04-17 SURGICAL SUPPLY — 33 items
CANISTER SUCT 1200ML W/VALVE (MISCELLANEOUS) ×2 IMPLANT
CLEANER CAUTERY TIP 5X5 PAD (MISCELLANEOUS) IMPLANT
COAGULATOR SUCT SWTCH 10FR 6 (ELECTROSURGICAL) ×2 IMPLANT
COVER BACK TABLE 60X90IN (DRAPES) ×2 IMPLANT
COVER MAYO STAND STRL (DRAPES) ×2 IMPLANT
COVER WAND RF STERILE (DRAPES) IMPLANT
ELECT COATED BLADE 2.86 ST (ELECTRODE) ×2 IMPLANT
ELECT REM PT RETURN 9FT ADLT (ELECTROSURGICAL)
ELECT REM PT RETURN 9FT PED (ELECTROSURGICAL)
ELECTRODE REM PT RETRN 9FT PED (ELECTROSURGICAL) IMPLANT
ELECTRODE REM PT RTRN 9FT ADLT (ELECTROSURGICAL) IMPLANT
GAUZE SPONGE 4X4 12PLY STRL LF (GAUZE/BANDAGES/DRESSINGS) ×2 IMPLANT
GLOVE BIOGEL M 7.0 STRL (GLOVE) ×2 IMPLANT
GLOVE BIOGEL PI IND STRL 7.0 (GLOVE) ×1 IMPLANT
GLOVE BIOGEL PI INDICATOR 7.0 (GLOVE) ×1
GLOVE ECLIPSE 6.5 STRL STRAW (GLOVE) ×2 IMPLANT
GOWN STRL REUS W/ TWL LRG LVL3 (GOWN DISPOSABLE) ×2 IMPLANT
GOWN STRL REUS W/TWL LRG LVL3 (GOWN DISPOSABLE) ×2
MARKER SKIN DUAL TIP RULER LAB (MISCELLANEOUS) IMPLANT
NS IRRIG 1000ML POUR BTL (IV SOLUTION) ×2 IMPLANT
PAD CLEANER CAUTERY TIP 5X5 (MISCELLANEOUS)
PENCIL SMOKE EVACUATOR (MISCELLANEOUS) ×2 IMPLANT
PIN SAFETY STERILE (MISCELLANEOUS) IMPLANT
SHEET MEDIUM DRAPE 40X70 STRL (DRAPES) ×2 IMPLANT
SOLUTION BUTLER CLEAR DIP (MISCELLANEOUS) ×2 IMPLANT
SPONGE TONSIL TAPE 1 RFD (DISPOSABLE) IMPLANT
SPONGE TONSIL TAPE 1.25 RFD (DISPOSABLE) ×2 IMPLANT
SYR BULB 3OZ (MISCELLANEOUS) ×2 IMPLANT
TOWEL GREEN STERILE FF (TOWEL DISPOSABLE) ×2 IMPLANT
TUBE CONNECTING 20X1/4 (TUBING) ×2 IMPLANT
TUBE SALEM SUMP 12R W/ARV (TUBING) IMPLANT
TUBE SALEM SUMP 16 FR W/ARV (TUBING) IMPLANT
YANKAUER SUCT BULB TIP NO VENT (SUCTIONS) ×2 IMPLANT

## 2019-04-17 NOTE — H&P (Signed)
Pamela [name] is an 24 y.o. female.   Chief Complaint: Recurrent tonsillitis  HPI: Hx of Recurrent tonsillitis  Past Medical History:  Diagnosis Date  . Anxiety   . Headache(784.0)   . Panic attacks     Past Surgical History:  Procedure Laterality Date  . TEAR DUCT PROBING    . WISDOM TOOTH EXTRACTION      Family History  Problem Relation Age of Onset  . Migraines Mother   . High blood pressure Mother    Social History:  reports that she has been smoking cigarettes. She has never used smokeless tobacco. She reports that she does not drink alcohol or use drugs.  Allergies:  Allergies  Allergen Reactions  . Amoxicillin-Pot Clavulanate     hives  . Glycopyrrolate     High fever  . Halothane     Medications Prior to Admission  Medication Sig Dispense Refill  . acetaminophen (TYLENOL) 500 MG tablet Take 500 mg by mouth every 6 (six) hours as needed.    Marland Kitchen ibuprofen (ADVIL,MOTRIN) 800 MG tablet Take 1 tablet (800 mg total) by mouth every 8 (eight) hours as needed for mild pain or moderate pain. 15 tablet 0  . Norethin Ace-Eth Estrad-FE (BLISOVI 24 FE PO) Take by mouth. Pt does not know dosage    . escitalopram (LEXAPRO) 10 MG tablet Take 10 mg by mouth daily.    Marland Kitchen LORazepam (ATIVAN) 1 MG tablet Take 1 tablet (1 mg total) by mouth 3 (three) times daily as needed for anxiety. 15 tablet 0  . naproxen (NAPROSYN) 500 MG tablet Take 1 tablet (500 mg total) by mouth 2 (two) times daily. 30 tablet 0    Results for orders placed or performed during the hospital encounter of 04/17/19 (from the past 48 hour(s))  Pregnancy, urine POC     Status: None   Collection Time: 04/17/19  6:44 AM  Result Value Ref Range   Preg Test, Ur NEGATIVE NEGATIVE    Comment:        THE SENSITIVITY OF THIS METHODOLOGY IS >24 mIU/mL    No results found.  Review of Systems  Constitutional: Negative.   HENT: Positive for sore throat.   Respiratory: Negative.   Cardiovascular: Negative.      Blood pressure (!) 119/58, pulse 70, temperature 98 F (36.7 C), temperature source Oral, resp. rate 18, height 5\' 8"  (1.727 m), weight 86.8 kg, last menstrual period 02/11/2019, SpO2 100 %. Physical Exam  Constitutional: She appears well-developed and well-nourished.  HENT:  Tonsil hypertrophy  Musculoskeletal:     Cervical back: Normal range of motion and neck supple.     Assessment/Plan Adm for OP T&A  14/07/2018, MD 04/17/2019, 7:26 AM

## 2019-04-17 NOTE — Anesthesia Preprocedure Evaluation (Addendum)
Anesthesia Evaluation  Patient identified by MRN, date of birth, ID band Patient awake    Reviewed: Allergy & Precautions, NPO status , Patient's Chart, lab work & pertinent test results  History of Anesthesia Complications (+) history of anesthetic complications (as an infant- exact reaction unknown)  Airway Mallampati: I  TM Distance: >3 FB Neck ROM: Full    Dental  (+) Teeth Intact   Pulmonary Current Smoker and Patient abstained from smoking.,    Pulmonary exam normal        Cardiovascular negative cardio ROS Normal cardiovascular exam     Neuro/Psych PSYCHIATRIC DISORDERS Anxiety negative neurological ROS     GI/Hepatic negative GI ROS, Neg liver ROS,   Endo/Other  negative endocrine ROS  Renal/GU negative Renal ROS  negative genitourinary   Musculoskeletal negative musculoskeletal ROS (+)   Abdominal   Peds  Hematology negative hematology ROS (+)   Anesthesia Other Findings   Reproductive/Obstetrics                            Anesthesia Physical Anesthesia Plan  ASA: II  Anesthesia Plan: General   Post-op Pain Management:    Induction: Intravenous  PONV Risk Score and Plan: 3 and Ondansetron, Dexamethasone, Treatment may vary due to age or medical condition, Midazolam, TIVA and Propofol infusion  Airway Management Planned: Oral ETT  Additional Equipment: None  Intra-op Plan:   Post-operative Plan: Extubation in OR  Informed Consent: I have reviewed the patients History and Physical, chart, labs and discussed the procedure including the risks, benefits and alternatives for the proposed anesthesia with the patient or authorized representative who has indicated his/her understanding and acceptance.     Dental advisory given  Plan Discussed with:   Anesthesia Plan Comments: (Pt has a halothane allergy listed in chart and had unknown reaction to anesthesia as an infant  for tear duct surgery. The only thing she knows by report from her mother is that she turned red and wouldn't stop crying, and had to be taken back to the hospital. She is unsure whether she had fevers but thinks she may have. She is unfamiliar with the term malignant hyperthermia and is unaware of ever being told to avoid certain anesthetics, however her knowledge of the event is minimal. Her only other surgery was wisdom tooth extraction. We have no anesthetic records available. Out of an abundance of precaution, we have elected to proceed with MH precautions and advised patient to obtain more information prior to future anesthetics. )       Anesthesia Quick Evaluation

## 2019-04-17 NOTE — Transfer of Care (Signed)
Immediate Anesthesia Transfer of Care Note  Patient: Pamela [name]  Procedure(s) Performed: TONSILLECTOMY (Bilateral Throat)  Patient Location: PACU  Anesthesia Type:General  Level of Consciousness: drowsy  Airway & Oxygen Therapy: Patient Spontanous Breathing and Patient connected to face mask oxygen  Post-op Assessment: Report given to RN and Post -op Vital signs reviewed and stable  Post vital signs: Reviewed and stable  Last Vitals:  Vitals Value Taken Time  BP 114/73 04/17/19 0820  Temp    Pulse 72 04/17/19 0826  Resp 12 04/17/19 0826  SpO2 98 % 04/17/19 0826  Vitals shown include unvalidated device data.  Last Pain:  Vitals:   04/17/19 0705  TempSrc: Oral  PainSc: 0-No pain      Patients Stated Pain Goal: 3 (04/17/19 0705)  Complications: No apparent anesthesia complications

## 2019-04-17 NOTE — Anesthesia Postprocedure Evaluation (Signed)
Anesthesia Post Note  Patient: Pamela [name]  Procedure(s) Performed: TONSILLECTOMY (Bilateral Throat)     Patient location during evaluation: PACU Anesthesia Type: General Level of consciousness: awake and alert Pain management: pain level controlled Vital Signs Assessment: post-procedure vital signs reviewed and stable Respiratory status: spontaneous breathing, nonlabored ventilation and respiratory function stable Cardiovascular status: blood pressure returned to baseline and stable Postop Assessment: no apparent nausea or vomiting Anesthetic complications: no    Last Vitals:  Vitals:   04/17/19 1015 04/17/19 1046  BP:    Pulse: 79 69  Resp:    Temp:    SpO2: 99% 97%    Last Pain:  Vitals:   04/17/19 1046  TempSrc:   PainSc: 3                  Lucretia Kern

## 2019-04-17 NOTE — Anesthesia Procedure Notes (Signed)
Procedure Name: Intubation Date/Time: 04/17/2019 7:50 AM Performed by: Marny Lowenstein, CRNA Pre-anesthesia Checklist: Patient identified, Emergency Drugs available, Suction available and Patient being monitored Patient Re-evaluated:Patient Re-evaluated prior to induction Oxygen Delivery Method: Circle system utilized Preoxygenation: Pre-oxygenation with 100% oxygen Induction Type: IV induction Ventilation: Mask ventilation without difficulty Laryngoscope Size: Miller and 2 Grade View: Grade I Tube type: Oral Tube size: 7.0 mm Number of attempts: 1 Airway Equipment and Method: Stylet Placement Confirmation: ETT inserted through vocal cords under direct vision,  positive ETCO2 and breath sounds checked- equal and bilateral Secured at: 21 cm Tube secured with: Tape Dental Injury: Teeth and Oropharynx as per pre-operative assessment

## 2019-04-17 NOTE — Op Note (Signed)
Operative Note: Tonsillectomy  [name]: Pamela [name]  Medical record number: [id number]  Date:04/17/2019  Pre-operative Indications: Recurrent Tonsillitis  Postoperative Indications: Same  Surgical Procedure: Tonsillectomy  Anesthesia: GET  Surgeon: Barbee Cough, M.D.  Complications: None  EBL: Minimal   Brief History: Pamela [name] is a 25 y.o. female with a history of recurrent acute tonsillitis and tonsillar hypertrophy. Pamela [name] has been on multiple courses of antibiotics for recurrent infection. Based on Pamela [name]'s history and findings I recommended tonsillectomy under general anesthesia, risks and benefits were discussed in detail with Pamela [name] and family. They understand and agree with our plan for surgery which is scheduled on elective basis at [facility].  Surgical Procedure: Pamela [name] is brought to Pamela operating room on 04/17/2019 and placed in supine position on Pamela operating table. General endotracheal anesthesia was established without difficulty. When Pamela [name] was adequately anesthetized, surgical timeout was performed and correct identification of Pamela [name] and Pamela surgical procedure. Pamela [name] was positioned and prepped and draped in sterile fashion.  With Pamela [name] prepared for surgery a Lisabeth Register mouth gag was inserted without difficulty, there were no loose or broken teeth and Pamela hard soft palate were intact. Tonsillectomy was then performed, using Bovie cautery and dissecting in a subcapsular fashion Pamela entire left tonsil was removed from superior pole to tongue base. Right tonsil removed in a similar fashion.  Tonsil tissue was sent to pathology for gross and microscopic evaluation.  Pamela tonsillar fossa were gently abraded with dry tonsil sponge and several small areas of point hemorrhage were cauterized with suction cautery. Pamela Crowe-Davis mouth gag was released and reapplied there is no active bleeding. Oral cavity and nasopharynx were  irrigated with saline. An orogastric tube was passed and stomach contents were aspirated. Mouthgag was removed, again no loose or broken teeth and no bleeding. [name] was awakened from anesthetic and extubated, then transferred from Pamela operating room to Pamela recovery room in stable condition. There were no complications and blood loss was minimal.   Barbee Cough, M.D. [facility] 04/17/2019

## 2019-04-17 NOTE — Discharge Instructions (Signed)

## 2019-04-18 ENCOUNTER — Encounter: Payer: Self-pay | Admitting: *Deleted

## 2019-04-18 LAB — SURGICAL PATHOLOGY

## 2019-04-24 MED FILL — ONDANSETRON ODT 4 MG TABLET: 4 | 6 days supply | Qty: 20 | Fill #0

## 2019-05-03 DIAGNOSIS — Z6828 Body mass index (BMI) 28.0-28.9, adult: Secondary | ICD-10-CM | POA: Diagnosis not present

## 2019-05-03 DIAGNOSIS — Z01419 Encounter for gynecological examination (general) (routine) without abnormal findings: Secondary | ICD-10-CM | POA: Diagnosis not present

## 2019-06-14 MED FILL — TARINA 24 FE 1-20 MG-MCG(24: 1-20 | 84 days supply | Qty: 84 | Fill #0

## 2019-09-08 MED FILL — TARINA 24 FE 1-20 MG-MCG(24: 1-20 | 84 days supply | Qty: 84 | Fill #1

## 2019-09-11 MED FILL — AZITHROMYCIN 250 MG TABS: 250 | 5 days supply | Qty: 6 | Fill #0

## 2019-09-18 DIAGNOSIS — H5213 Myopia, bilateral: Secondary | ICD-10-CM | POA: Diagnosis not present

## 2019-10-09 MED FILL — DIAZEPAM 10 MG TABS: 10 | 5 days supply | Qty: 5 | Fill #0

## 2019-10-09 MED FILL — TRIAMCINOLONE ACETONIDE 0.1: 0.1 | 7 days supply | Qty: 5 | Fill #0

## 2019-10-10 MED FILL — AZITHROMYCIN 250 MG TABS: 250 | 5 days supply | Qty: 6 | Fill #1

## 2019-12-14 MED FILL — TARINA 24 FE 1-20 MG-MCG(24: 1-20 | 84 days supply | Qty: 84 | Fill #2

## 2020-01-09 ENCOUNTER — Other Ambulatory Visit (HOSPITAL_COMMUNITY): Payer: Self-pay | Admitting: Family Medicine

## 2020-01-09 MED FILL — ALBUTEROL SULFATE HFA 108 (: 108 (90 BAS | 16 days supply | Qty: 18 | Fill #0

## 2020-01-09 MED FILL — HYDROCODONE-HOMATROPINE SYR: 5-1.5 | 5 days supply | Qty: 100 | Fill #0

## 2020-03-12 ENCOUNTER — Other Ambulatory Visit (HOSPITAL_COMMUNITY): Payer: Self-pay | Admitting: Family Medicine

## 2020-03-12 MED FILL — FLUTICASONE PROP 50 MCG SPR: 50 | 30 days supply | Qty: 16 | Fill #0

## 2020-03-12 MED FILL — PROMETHAZINE W/DM SYRUP: 6.25-15 | 5 days supply | Qty: 100 | Fill #0

## 2020-11-04 ENCOUNTER — Other Ambulatory Visit (HOSPITAL_COMMUNITY): Payer: Self-pay

## 2020-11-04 ENCOUNTER — Encounter (HOSPITAL_COMMUNITY): Payer: Self-pay

## 2020-11-04 ENCOUNTER — Emergency Department (HOSPITAL_COMMUNITY): Payer: Self-pay

## 2020-11-04 ENCOUNTER — Emergency Department (HOSPITAL_COMMUNITY)
Admission: EM | Admit: 2020-11-04 | Discharge: 2020-11-04 | Disposition: A | Discharge status: Home / Self Care | Payer: Self-pay | Attending: Emergency Medicine | Admitting: Emergency Medicine

## 2020-11-04 ENCOUNTER — Other Ambulatory Visit: Payer: Self-pay

## 2020-11-04 DIAGNOSIS — F17210 Nicotine dependence, cigarettes, uncomplicated: Secondary | ICD-10-CM | POA: Insufficient documentation

## 2020-11-04 DIAGNOSIS — M25532 Pain in left wrist: Secondary | ICD-10-CM | POA: Insufficient documentation

## 2020-11-04 DIAGNOSIS — M899 Disorder of bone, unspecified: Secondary | ICD-10-CM

## 2020-11-04 MED ORDER — HYDROCODONE-ACETAMINOPHEN 5-325 MG PO TABS: 1.0000 | ORAL_TABLET | Freq: Four times a day (QID) | ORAL | 0 refills | Status: AC | PRN

## 2020-11-04 MED ORDER — HYDROCODONE-ACETAMINOPHEN 5-325 MG PO TABS
1.0000 | ORAL_TABLET | Freq: Four times a day (QID) | ORAL | 0 refills | Status: DC | PRN
  Filled 2020-11-04: qty 10, 3d supply, fill #0

## 2020-11-04 MED ORDER — HYDROCODONE-ACETAMINOPHEN 5-325 MG PO TABS: 1.0000 | ORAL_TABLET | Freq: Four times a day (QID) | ORAL | 0 refills | Status: DC | PRN

## 2020-11-04 MED ORDER — OXYCODONE-ACETAMINOPHEN 5-325 MG PO TABS
1.0000 | ORAL_TABLET | Freq: Once | ORAL | Status: AC
  Administered 2020-11-04: 1 via ORAL
  Filled 2020-11-04: qty 1

## 2020-11-04 MED ORDER — ONDANSETRON 4 MG PO TBDP
4.0000 mg | ORAL_TABLET | Freq: Once | ORAL | Status: AC
  Administered 2020-11-04: 4 mg via ORAL
  Filled 2020-11-04: qty 1

## 2020-11-04 NOTE — ED Provider Notes (Signed)
Emergency Medicine Provider Triage Evaluation Note  Pamela [name] , a 25 y.o. female  was evaluated in triage.  Pt complains of left wrist pain which is chronic but worsened today.  Review of Systems  Positive: Left wrist pain Negative: fever  Physical Exam  BP 117/71 (BP Location: Right Arm)   Pulse 81   Temp 98.5 F (36.9 C) (Oral)   Resp 16   Ht 5\' 8"  (1.727 m)   Wt 81.6 kg   LMP 10/21/2020 (Approximate)   SpO2 99%   BMI 27.37 kg/m  Gen:   Awake, no distress   Resp:  Normal effort  MSK:   Moves extremities without difficulty  Other:    Medical Decision Making  Medically screening exam initiated at 3:11 PM.  Appropriate orders placed.  TASHEIKA KITZMILLER was informed that the remainder of the evaluation will be completed by another provider, this initial triage assessment does not replace that evaluation, and the importance of remaining in the ED until their evaluation is complete.     Pamela [name] 11/04/20 1511    11/06/20, MD 11/05/20 [phone]

## 2020-11-04 NOTE — Discharge Instructions (Addendum)
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 You will likely need to have an outpatient MRI completed   Prescription given for Norco. Take medication as directed and do not operate machinery, drive a car, or work while taking this medication as it can make you drowsy.   Call Dr. Carlos Levering office tomorrow to schedule an appointment for follow up  Please return to the emergency department for any new or worsening symptoms.

## 2020-11-04 NOTE — ED Provider Notes (Signed)
[facility]-EMERGENCY DEPT Provider Note   CSN: [id number] Arrival date & time: 11/04/20  1208     History Chief Complaint  Patient presents with   Wrist Pain    Pamela [name] is a 25 y.o. female.  HPI  25 year old female with a history of anxiety, headaches, panic attacks, who presents to the emergency department today for evaluation of left wrist pain.  States that she has had left wrist pain for several weeks and has been following with Dr. Amanda Pea with orthopedics.  She has had an x-ray that she states was largely unrevealing.  She has been given a wrist brace and has started on meloxicam and tramadol which had been providing some relief up until last night when the pain woke her up from sleep.  Now she stating she has decreased range of motion of the left wrist and increased pain.  There is no redness or fevers  Past Medical History:  Diagnosis Date   Anxiety    Headache(784.0)    Panic attacks     Patient Active Problem List   Diagnosis Date Noted   Acute tonsillitis 04/17/2019   Tonsillitis 04/17/2019   Migraine without aura, without mention of intractable migraine without mention of status migrainosus 10/27/2012   Panic attacks 10/27/2012    Past Surgical History:  Procedure Laterality Date   TEAR DUCT PROBING     TONSILLECTOMY Bilateral 04/17/2019   Procedure: TONSILLECTOMY;  Surgeon: Osborn Coho, MD;  Location: [facility];  Service: ENT;  Laterality: Bilateral;   WISDOM TOOTH EXTRACTION       OB History   No obstetric history on file.     Family History  Problem Relation Age of Onset   Migraines Mother    High blood pressure Mother     Social History   Tobacco Use   Smoking status: Every Day    Packs/day: 1.00    Types: Cigarettes   Smokeless tobacco: Never   Tobacco comments:    One pack daily  Vaping Use   Vaping Use: Never used  Substance Use Topics   Alcohol use: No   Drug use: No    Home  Medications Prior to Admission medications   Medication Sig Start Date End Date Taking? Authorizing Provider  HYDROcodone-acetaminophen (NORCO/VICODIN) 5-325 MG tablet Take 1 tablet by mouth every 6 (six) hours as needed. 11/04/20  Yes Gray Maugeri S, PA-C  acetaminophen (TYLENOL) 500 MG tablet Take 500 mg by mouth every 6 (six) hours as needed.    [provider]  albuterol (VENTOLIN HFA) 108 (90 Base) MCG/ACT inhaler INHALE 1 PUFF BY MOUTH EVERY 4 HOURS AS NEEDED 01/09/20 01/08/21  Deatra James, MD  escitalopram (LEXAPRO) 10 MG tablet Take 10 mg by mouth daily.    [provider]  fluticasone (FLONASE) 50 MCG/ACT nasal spray USE 1 - 2 SPRAYS IN NOSE ONCE DAILY AS DIRECTED 03/12/20 03/12/21  Deatra James, MD  HYDROcodone-acetaminophen (HYCET) 7.5-325 mg/15 ml solution Take 10-15 mLs by mouth every 4 (four) hours as needed for moderate pain. 04/17/19   Osborn Coho, MD  HYDROcodone-acetaminophen (HYCET) 7.5-325 mg/15 ml solution Take 10-15 mLs by mouth every 6 (six) hours as needed for moderate pain (alternate with motrin). 04/17/19   Osborn Coho, MD  ibuprofen (ADVIL,MOTRIN) 800 MG tablet Take 1 tablet (800 mg total) by mouth every 8 (eight) hours as needed for mild pain or moderate pain. 11/13/15   Trixie Dredge, PA-C  LORazepam (ATIVAN) 1  MG tablet Take 1 tablet (1 mg total) by mouth 3 (three) times daily as needed for anxiety. 10/25/12   Gilda Crease, MD  naproxen (NAPROSYN) 500 MG tablet Take 1 tablet (500 mg total) by mouth 2 (two) times daily. 08/15/13   Antony Madura, PA-C  Norethin Ace-Eth Estrad-FE (BLISOVI 24 FE PO) Take by mouth. Pt does not know dosage    [provider]  promethazine-dextromethorphan (PROMETHAZINE-DM) 6.25-15 MG/5ML syrup TAKE 5 ML BY MOUTH EVERY 6 HOURS AS NEEDED 03/12/20 03/12/21  Deatra James, MD    Allergies    Amoxicillin-pot clavulanate, Glycopyrrolate, and Halothane  Review of Systems   Review of Systems  Constitutional:  Negative for  fever.  Musculoskeletal:        Left wrist pain  Neurological:  Negative for weakness and numbness.   Physical Exam Updated Vital Signs BP 129/83 (BP Location: Right Arm)   Pulse 91   Temp 98.2 F (36.8 C) (Oral)   Resp 18   Ht 5\' 8"  (1.727 m)   Wt 81.6 kg   LMP 10/21/2020 (Approximate)   SpO2 100%   BMI 27.37 kg/m   Physical Exam Vitals and nursing note reviewed.  Constitutional:      General: She is not in acute distress.    Appearance: She is well-developed.  HENT:     Head: Normocephalic and atraumatic.  Eyes:     Conjunctiva/sclera: Conjunctivae normal.  Cardiovascular:     Rate and Rhythm: Normal rate.  Pulmonary:     Effort: Pulmonary effort is normal.  Musculoskeletal:        General: Normal range of motion.     Cervical back: Neck supple.     Comments: TTP to the dorsum of the left wrist.  She does have decreased range of motion with flexion of the wrist but extension of the wrist is intact.  Radial pulses are strong and patient has good cap refill distally.  She does have some decrease sensation over the left thumb.  Range of motion of the fingers is largely intact  Skin:    General: Skin is warm and dry.  Neurological:     Mental Status: She is alert.    ED Results / Procedures / Treatments   Labs (all labs ordered are listed, but only abnormal results are displayed) Labs Reviewed - No data to display  EKG None  Radiology DG Wrist Complete Left  Result Date: 11/04/2020 CLINICAL DATA:  6-8 months of left wrist pain.  Worse since today. EXAM: LEFT WRIST - COMPLETE 3+ VIEW COMPARISON:  None. 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 Mild negative ulnar variance. No acute displaced fracture or dislocation of the bones of the left wrist. Soft tissues are unremarkable. 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 Consider outpatient MRI for further evaluation. Electronically Signed   By: 11/06/2020 M.D.    On: 11/04/2020 16:56    Procedures Procedures   Medications Ordered in ED Medications  oxyCODONE-acetaminophen (PERCOCET/ROXICET) 5-325 MG per tablet 1 tablet (1 tablet Oral Given 11/04/20 2206)  ondansetron (ZOFRAN-ODT) disintegrating tablet 4 mg (4 mg Oral Given 11/04/20 2207)    ED Course  I have reviewed the triage vital signs and the nursing notes.  Pertinent labs & imaging results that were available during my care of the patient were reviewed by me and considered in my medical decision making (see chart for details).    MDM Rules/Calculators/A&P  25 year old female presenting for evaluation of left wrist pain ongoing for several weeks but worsened overnight.  X-ray of the wrist shows intercarpal arthropathy with sclerosis of the lunate.  MRI as an outpatient is recommended.  Patient made is made aware of these results and states she plans to call Dr. Carlos Levering office tomorrow to schedule an appointment.  She has a wrist splint at home which I recommended she wear.  I advised to continue taking the meloxicam.  Will give Rx for Norco and I will give her a work note.  Have advised on strict return precautions.  She voices understanding the plan and reasons to return.  All questions answered.  Patient stable for discharge   Final Clinical Impression(s) / ED Diagnoses Final diagnoses:  Disorder of bone, unspecified    Rx / DC Orders ED Discharge Orders          Ordered    HYDROcodone-acetaminophen (NORCO/VICODIN) 5-325 MG tablet  Every 6 hours PRN        11/04/20 2201             Karrie Meres, PA-C 11/04/20 2213    Tegeler, Canary Brim, MD 11/05/20 [phone]

## 2020-11-04 NOTE — ED Triage Notes (Addendum)
Patient c/o left wrist pain x 6-8 months and worse since 0230 today. Patient states she has numbness and tingling and can not move her let wrist at all.

## 2020-11-05 ENCOUNTER — Other Ambulatory Visit (HOSPITAL_COMMUNITY): Payer: Self-pay

## 2020-11-07 ENCOUNTER — Other Ambulatory Visit (HOSPITAL_COMMUNITY): Payer: Self-pay

## 2020-11-07 MED ORDER — TRAMADOL HCL 50 MG PO TABS
ORAL_TABLET | ORAL | 0 refills | Status: AC
  Filled 2020-11-07: qty 50, 8d supply, fill #0

## 2020-11-13 ENCOUNTER — Other Ambulatory Visit (HOSPITAL_COMMUNITY): Payer: Self-pay

## 2021-07-01 ENCOUNTER — Other Ambulatory Visit (HOSPITAL_COMMUNITY): Payer: Self-pay

## 2021-07-01 MED ORDER — ONDANSETRON 8 MG PO TBDP
ORAL_TABLET | ORAL | 0 refills | Status: AC
  Filled 2021-07-01: qty 15, 5d supply, fill #0

## 2021-07-01 MED ORDER — METHOCARBAMOL 500 MG PO TABS
ORAL_TABLET | ORAL | 0 refills | Status: AC
  Filled 2021-07-01: qty 40, 10d supply, fill #0

## 2021-07-01 MED ORDER — OXYCODONE HCL 5 MG PO TABS
ORAL_TABLET | ORAL | 0 refills | Status: DC
  Filled 2021-07-01: qty 42, 7d supply, fill #0

## 2021-07-01 MED ORDER — CEPHALEXIN 500 MG PO CAPS
ORAL_CAPSULE | ORAL | 0 refills | Status: AC
  Filled 2021-07-01: qty 20, 5d supply, fill #0

## 2021-07-24 ENCOUNTER — Emergency Department (HOSPITAL_COMMUNITY)
Admission: EM | Admit: 2021-07-24 | Discharge: 2021-07-24 | Disposition: A | Discharge status: Home / Self Care | Payer: Medicaid Other | Attending: Emergency Medicine | Admitting: Emergency Medicine

## 2021-07-24 ENCOUNTER — Other Ambulatory Visit: Payer: Self-pay

## 2021-07-24 ENCOUNTER — Other Ambulatory Visit (HOSPITAL_COMMUNITY): Payer: Self-pay

## 2021-07-24 ENCOUNTER — Encounter (HOSPITAL_COMMUNITY): Payer: Self-pay | Admitting: Oncology

## 2021-07-24 DIAGNOSIS — L03115 Cellulitis of right lower limb: Secondary | ICD-10-CM | POA: Diagnosis present

## 2021-07-24 MED ORDER — DOXYCYCLINE HYCLATE 100 MG PO CAPS
100.0000 mg | ORAL_CAPSULE | Freq: Two times a day (BID) | ORAL | 0 refills | Status: AC
  Filled 2021-07-24: qty 20, 10d supply, fill #0

## 2021-07-24 NOTE — Discharge Instructions (Addendum)
Please take antibiotics as prescribed.  Please return to the emergency department for any red flag symptoms like we discussed today.

## 2021-07-24 NOTE — ED Provider Notes (Signed)
[facility]-EMERGENCY DEPT Provider Note   CSN: [id number] Arrival date & time: 07/24/21  1142     History Chief Complaint  Patient presents with   Cellulitis    Pamela [name] is a 26 y.o. female with no significant past medical history who presents to the emergency department today for further evaluation of swelling and redness over the right ankle.  This been going on for 3 days.  Patient states she had a bug bite just prior to this starting.  She denies any fever, chills, chest pain, shortness of breath, purulent drainage.  HPI     Home Medications Prior to Admission medications   Medication Sig Start Date End Date Taking? Authorizing Provider  doxycycline (VIBRAMYCIN) 100 MG capsule Take 1 capsule  by mouth 2 times daily. 07/24/21  Yes Meredeth Ide, Munachimso Rigdon M, PA-C  albuterol (VENTOLIN HFA) 108 (90 Base) MCG/ACT inhaler INHALE 1 PUFF BY MOUTH EVERY 4 HOURS AS NEEDED 01/09/20 01/08/21  Deatra James, MD  cephALEXin (KEFLEX) 500 MG capsule Take 1 capsule by mouth 4 times a day. 07/01/21     escitalopram (LEXAPRO) 10 MG tablet Take 10 mg by mouth daily.    [provider]  fluticasone (FLONASE) 50 MCG/ACT nasal spray USE 1 - 2 SPRAYS IN NOSE ONCE DAILY AS DIRECTED 03/12/20 03/12/21  Deatra James, MD  HYDROcodone-acetaminophen (NORCO/VICODIN) 5-325 MG tablet Take 1 tablet by mouth every 6 (six) hours as needed. 11/04/20   Couture, Cortni S, PA-C  ibuprofen (ADVIL,MOTRIN) 800 MG tablet Take 1 tablet (800 mg total) by mouth every 8 (eight) hours as needed for mild pain or moderate pain. 11/13/15   Trixie Dredge, PA-C  LORazepam (ATIVAN) 1 MG tablet Take 1 tablet (1 mg total) by mouth 3 (three) times daily as needed for anxiety. 10/25/12   Gilda Crease, MD  methocarbamol (ROBAXIN) 500 MG tablet Take 1 tablet by mouth every 6 - 8 hours as needed for spasm 07/01/21     naproxen (NAPROSYN) 500 MG tablet Take 1 tablet (500 mg total) by mouth 2 (two) times daily. 08/15/13    Antony Madura, PA-C  Norethin Ace-Eth Estrad-FE (BLISOVI 24 FE PO) Take by mouth. Pt does not know dosage    [provider]  ondansetron (ZOFRAN-ODT) 8 MG disintegrating tablet Dissolve 1 tablet by mouth every 8 hours if needed for nausea 07/01/21     oxyCODONE (OXY IR/ROXICODONE) 5 MG immediate release tablet Take 1 tablet by mouth every 4 - 6 hours as needed for pain 07/01/21     traMADol (ULTRAM) 50 MG tablet Take 1 tablet by mouth every 4 - 6 hours as needed 11/07/20         Allergies    Amoxicillin-pot clavulanate, Glycopyrrolate, and Halothane    Review of Systems   Review of Systems  All other systems reviewed and are negative.  Physical Exam Updated Vital Signs BP 135/85 (BP Location: Right Arm)   Pulse 100   Temp 98.1 F (36.7 C) (Oral)   Resp 18   Ht 5\' 8"  (1.727 m)   Wt 79.4 kg   LMP 07/01/2021 (Approximate)   SpO2 97%   BMI 26.61 kg/m  Physical Exam Vitals and nursing note reviewed.  Constitutional:      Appearance: Normal appearance.  HENT:     Head: Normocephalic and atraumatic.  Eyes:     General:        Right eye: No discharge.  Left eye: No discharge.     Conjunctiva/sclera: Conjunctivae normal.  Pulmonary:     Effort: Pulmonary effort is normal.  Skin:    General: Skin is warm and dry.     Findings: Erythema present.     Comments: Mild erythema and warmth present over the right lateral malleolus.  No obvious purulence.  No swelling.  2+ dorsalis pedis pulse felt on the right.  Good range of motion in the ankle.  Neurological:     General: No focal deficit present.     Mental Status: She is alert.  Psychiatric:        Mood and Affect: Mood normal.        Behavior: Behavior normal.    ED Results / Procedures / Treatments   Labs (all labs ordered are listed, but only abnormal results are displayed) Labs Reviewed - No data to display  EKG None  Radiology No results found.  Procedures Procedures    Medications Ordered in  ED Medications - No data to display  ED Course/ Medical Decision Making/ A&P                           Medical Decision Making Pamela [name] is a 26 y.o. female who presents to the emergency department today for further evaluation of right ankle infection.  This seems to be consistent with cellulitis.  There is no lymphangitic spread present on my physical exam.  I have a low suspicion for osteomyelitis at this time.  No evidence of compartment syndrome.  No local fluctuance to indicate any abscess.  Patient has not tried any outpatient therapy.  I will prescribe her Keflex and have her follow-up in the outpatient setting.  I discussed red flag symptoms for her to return to the emergency department immediately.  Patient expressed full understanding.  She is safe for discharge.  She does not meet inpatient criteria at this time.  On further chart review, patient does have a penicillin allergy.  I was initially going to put her on Keflex but considering the cross-reactivity I will place her on doxycycline.   Risk OTC drugs. Prescription drug management. Decision regarding hospitalization.   Final Clinical Impression(s) / ED Diagnoses Final diagnoses:  Cellulitis of right lower extremity    Rx / DC Orders ED Discharge Orders          Ordered    doxycycline (VIBRAMYCIN) 100 MG capsule  2 times daily        07/24/21 1223              Honor Loh [place], [place] 07/24/21 1224    Arby Barrette, MD 07/25/21 [phone]

## 2021-07-24 NOTE — ED Triage Notes (Signed)
Pt reports noticing redness, swelling and pain to right lower leg, ankle and foot.

## 2021-11-20 ENCOUNTER — Other Ambulatory Visit (HOSPITAL_COMMUNITY): Payer: Self-pay

## 2021-11-21 ENCOUNTER — Other Ambulatory Visit (HOSPITAL_COMMUNITY): Payer: Self-pay

## 2021-11-21 MED ORDER — TRAMADOL HCL 50 MG PO TABS
50.0000 mg | ORAL_TABLET | ORAL | 0 refills | Status: AC
  Filled 2021-11-21: qty 50, 9d supply, fill #0

## 2021-12-06 ENCOUNTER — Other Ambulatory Visit (HOSPITAL_COMMUNITY): Payer: Self-pay
